# Patient Record
Sex: Male | Born: 1980 | Race: White | Hispanic: No | Marital: Married | Smoking: Current every day smoker
Health system: Southern US, Community
[De-identification: ages and names within clinical notes are randomized; demographics above are authoritative.]

## PROBLEM LIST (undated history)

## (undated) DIAGNOSIS — F419 Anxiety disorder, unspecified: Secondary | ICD-10-CM

## (undated) DIAGNOSIS — E7800 Pure hypercholesterolemia, unspecified: Secondary | ICD-10-CM

## (undated) HISTORY — PX: APPENDECTOMY: SHX54

## (undated) HISTORY — PX: HERNIA REPAIR: SHX51

---

## 2015-05-11 ENCOUNTER — Emergency Department (HOSPITAL_BASED_OUTPATIENT_CLINIC_OR_DEPARTMENT_OTHER): Payer: Medicaid Other

## 2015-05-11 ENCOUNTER — Emergency Department (HOSPITAL_BASED_OUTPATIENT_CLINIC_OR_DEPARTMENT_OTHER)
Admission: EM | Admit: 2015-05-11 | Discharge: 2015-05-12 | Disposition: A | Discharge status: Home / Self Care | Payer: Medicaid Other | Attending: Emergency Medicine | Admitting: Emergency Medicine

## 2015-05-11 ENCOUNTER — Encounter (HOSPITAL_BASED_OUTPATIENT_CLINIC_OR_DEPARTMENT_OTHER): Payer: Self-pay

## 2015-05-11 DIAGNOSIS — Y998 Other external cause status: Secondary | ICD-10-CM | POA: Insufficient documentation

## 2015-05-11 DIAGNOSIS — E780 Pure hypercholesterolemia: Secondary | ICD-10-CM | POA: Insufficient documentation

## 2015-05-11 DIAGNOSIS — F419 Anxiety disorder, unspecified: Secondary | ICD-10-CM | POA: Diagnosis not present

## 2015-05-11 DIAGNOSIS — Z720 Tobacco use: Secondary | ICD-10-CM | POA: Diagnosis not present

## 2015-05-11 DIAGNOSIS — Z79899 Other long term (current) drug therapy: Secondary | ICD-10-CM | POA: Diagnosis not present

## 2015-05-11 DIAGNOSIS — T189XXA Foreign body of alimentary tract, part unspecified, initial encounter: Secondary | ICD-10-CM | POA: Diagnosis present

## 2015-05-11 DIAGNOSIS — X58XXXA Exposure to other specified factors, initial encounter: Secondary | ICD-10-CM | POA: Diagnosis not present

## 2015-05-11 DIAGNOSIS — Y9289 Other specified places as the place of occurrence of the external cause: Secondary | ICD-10-CM | POA: Diagnosis not present

## 2015-05-11 DIAGNOSIS — Y9389 Activity, other specified: Secondary | ICD-10-CM | POA: Insufficient documentation

## 2015-05-11 HISTORY — DX: Anxiety disorder, unspecified: F41.9

## 2015-05-11 HISTORY — DX: Pure hypercholesterolemia, unspecified: E78.00

## 2015-05-11 LAB — CBC WITH DIFFERENTIAL/PLATELET
BASOS ABS: 0.1 10*3/uL (ref 0.0–0.1)
BASOS PCT: 1 % (ref 0–1)
Eosinophils Absolute: 0.3 10*3/uL (ref 0.0–0.7)
Eosinophils Relative: 3 % (ref 0–5)
HEMATOCRIT: 45 % (ref 39.0–52.0)
HEMOGLOBIN: 15.7 g/dL (ref 13.0–17.0)
LYMPHS PCT: 38 % (ref 12–46)
Lymphs Abs: 3.3 10*3/uL (ref 0.7–4.0)
MCH: 31.5 pg (ref 26.0–34.0)
MCHC: 34.9 g/dL (ref 30.0–36.0)
MCV: 90.2 fL (ref 78.0–100.0)
MONO ABS: 0.5 10*3/uL (ref 0.1–1.0)
MONOS PCT: 6 % (ref 3–12)
NEUTROS ABS: 4.5 10*3/uL (ref 1.7–7.7)
NEUTROS PCT: 52 % (ref 43–77)
Platelets: 260 10*3/uL (ref 150–400)
RBC: 4.99 MIL/uL (ref 4.22–5.81)
RDW: 12.2 % (ref 11.5–15.5)
WBC: 8.7 10*3/uL (ref 4.0–10.5)

## 2015-05-11 LAB — COMPREHENSIVE METABOLIC PANEL
ALBUMIN: 3.9 g/dL (ref 3.5–5.0)
ALK PHOS: 43 U/L (ref 38–126)
ALT: 15 U/L — AB (ref 17–63)
AST: 5 U/L — ABNORMAL LOW (ref 15–41)
Anion gap: 8 (ref 5–15)
BILIRUBIN TOTAL: 0.2 mg/dL — AB (ref 0.3–1.2)
BUN: 12 mg/dL (ref 6–20)
CALCIUM: 8.9 mg/dL (ref 8.9–10.3)
CO2: 25 mmol/L (ref 22–32)
CREATININE: 0.83 mg/dL (ref 0.61–1.24)
Chloride: 106 mmol/L (ref 101–111)
GFR calc Af Amer: >60 mL/min (ref >60)
GFR calc non Af Amer: >60 mL/min (ref >60)
GLUCOSE: 108 mg/dL — AB (ref 65–99)
Potassium: 3.7 mmol/L (ref 3.5–5.1)
SODIUM: 139 mmol/L (ref 135–145)
TOTAL PROTEIN: 6.3 g/dL — AB (ref 6.5–8.1)

## 2015-05-11 LAB — LIPASE, BLOOD: Lipase: 55 U/L — ABNORMAL HIGH (ref 22–51)

## 2015-05-11 MED ORDER — FENTANYL CITRATE (PF) 100 MCG/2ML IJ SOLN
50.0000 ug | Freq: Once | INTRAMUSCULAR | Status: AC
  Administered 2015-05-12: 50 ug via INTRAVENOUS
  Filled 2015-05-11: qty 2

## 2015-05-11 MED ORDER — SODIUM CHLORIDE 0.9 % IV BOLUS (SEPSIS)
1000.0000 mL | Freq: Once | INTRAVENOUS | Status: AC
  Administered 2015-05-11: 1000 mL via INTRAVENOUS

## 2015-05-11 MED ORDER — SODIUM CHLORIDE 0.9 % IV SOLN: INTRAVENOUS | Status: DC

## 2015-05-11 MED ORDER — ONDANSETRON HCL 4 MG/2ML IJ SOLN
4.0000 mg | Freq: Once | INTRAMUSCULAR | Status: AC
  Administered 2015-05-11: 4 mg via INTRAVENOUS
  Filled 2015-05-11: qty 2

## 2015-05-11 MED ORDER — LORAZEPAM 2 MG/ML IJ SOLN
1.0000 mg | Freq: Once | INTRAMUSCULAR | Status: AC
  Administered 2015-05-11: 1 mg via INTRAVENOUS
  Filled 2015-05-11: qty 1

## 2015-05-11 MED ORDER — FENTANYL CITRATE (PF) 100 MCG/2ML IJ SOLN
50.0000 ug | Freq: Once | INTRAMUSCULAR | Status: AC
  Administered 2015-05-11: 50 ug via INTRAVENOUS
  Filled 2015-05-11: qty 2

## 2015-05-11 MED ORDER — PANTOPRAZOLE SODIUM 40 MG IV SOLR
40.0000 mg | Freq: Once | INTRAVENOUS | Status: AC
  Administered 2015-05-11: 40 mg via INTRAVENOUS
  Filled 2015-05-11: qty 40

## 2015-05-11 MED ORDER — SODIUM CHLORIDE 0.9 % IV BOLUS (SEPSIS)
500.0000 mL | Freq: Once | INTRAVENOUS | Status: AC
  Administered 2015-05-11: 500 mL via INTRAVENOUS

## 2015-05-11 MED ORDER — SODIUM CHLORIDE 0.9 % IV SOLN
INTRAVENOUS | Status: DC
  Administered 2015-05-12: via INTRAVENOUS

## 2015-05-11 NOTE — ED Notes (Signed)
Reports was drinking out of plastic cup but his grandmother keeps all cups in the freezer even mason jars and thinks he swallowed a piece of glass from where a mason jar busted in the cup.  Pt reports pain with swallowing and abdominal pain

## 2015-05-11 NOTE — ED Notes (Signed)
Pt restless/ writhing d/t abd pain, (denies: throat, chest or epigastric pain, nvd, bleeding, sob, cough, dizziness or other sx), no dyspnea noted.

## 2015-05-11 NOTE — ED Provider Notes (Signed)
CSN: [id number]     Arrival date & time 05/11/15  2050 History  This chart was scribed for Vanetta Mulders, MD by Budd Palmer, ED Scribe. This patient was seen in room MH06/MH06 and the patient's care was started at 10:06 PM.     Chief Complaint  Patient presents with  . Swallowed Foreign Body   The history is provided by the patient. No language interpreter was used.   HPI Comments: Darcey Demma is a 34 y.o. male who presents to the Emergency Department complaining of swallowing a foreign body close to 2 hours ago. Pt states he drank out of a plastic cup kept in the freezer together with glass jars. He reports suspecting he swallowed a piece of glass from where one of the jars broke in the cup. Pt notes associated epigastric and LUQ abdominal pain with swallowing. He states he is taking Protonix once daily. He denies a PMHx of GI problems. Pt denies nausea and vomiting.  Past Medical History  Diagnosis Date  . High cholesterol   . Anxiety    Past Surgical History  Procedure Laterality Date  . Appendectomy    . Hernia repair     No family history on file. Social History  Substance Use Topics  . Smoking status: Current Every Day Smoker -- 1.00 packs/day    Types: Cigarettes  . Smokeless tobacco: None  . Alcohol Use: No    Review of Systems  Constitutional: Negative for fever and chills.  HENT: Negative for rhinorrhea and sore throat.   Eyes: Negative for visual disturbance.  Respiratory: Negative for cough and shortness of breath.   Cardiovascular: Negative for chest pain and leg swelling.  Gastrointestinal: Positive for abdominal pain. Negative for nausea and vomiting.  Genitourinary: Negative for dysuria.  Musculoskeletal: Negative for back pain.  Skin: Negative for rash.  Neurological: Negative for headaches.  Hematological: Does not bruise/bleed easily.    Allergies  Sulfa antibiotics  Home Medications   Prior to Admission medications   Medication Sig Start  Date End Date Taking? Authorizing Provider  alprazolam Prudy Feeler) 2 MG tablet Take 2 mg by mouth at bedtime as needed for sleep.   Yes Historical Provider, MD  atorvastatin (LIPITOR) 20 MG tablet Take 20 mg by mouth daily.   Yes Historical Provider, MD  citalopram (CELEXA) 40 MG tablet Take 40 mg by mouth daily.   Yes Historical Provider, MD  gemfibrozil (LOPID) 600 MG tablet Take 600 mg by mouth 2 (two) times daily before a meal.   Yes Historical Provider, MD  pantoprazole (PROTONIX) 40 MG tablet Take 40 mg by mouth daily.   Yes Historical Provider, MD  traMADol (ULTRAM) 50 MG tablet Take by mouth every 6 (six) hours as needed.   Yes Historical Provider, MD   BP 147/92 mmHg  Pulse 95  Temp(Src) 97.8 F (36.6 C) (Oral)  Resp 22  Ht  (1.778 m)  Wt 209 lb (94.802 kg)  BMI 29.99 kg/m2  SpO2 98% Physical Exam  Constitutional: He is oriented to person, place, and time. He appears well-developed and well-nourished.  HENT:  Head: Normocephalic and atraumatic.  Mouth/Throat: Oropharynx is clear and moist.  Uvula is midline  Eyes: Conjunctivae and EOM are normal. Pupils are equal, round, and reactive to light. Right eye exhibits no discharge. Left eye exhibits no discharge. No scleral icterus.  Cardiovascular: Normal rate, regular rhythm and normal heart sounds.   Pulmonary/Chest: Effort normal and breath sounds normal. No respiratory distress.  Abdominal: Soft. Bowel sounds are normal. There is tenderness. There is no guarding.  Musculoskeletal: He exhibits no edema.  No ankle swelling  Neurological: He is alert and oriented to person, place, and time. No cranial nerve deficit. He exhibits normal muscle tone. Coordination normal.  Skin: Skin is warm and dry. No rash noted. He is not diaphoretic. No erythema.  Psychiatric: He has a normal mood and affect.  Nursing note and vitals reviewed.   ED Course  Procedures  DIAGNOSTIC STUDIES: Oxygen Saturation is 98% on RA, normal by my  interpretation.    COORDINATION OF CARE: 10:12 PM - Discussed possibility of inconclusive XR due to material type. Discussed plans to order diagnostic imaging and anti-nausea medication. Pt advised of plan for treatment and pt agrees.  Labs Review Labs Reviewed  LIPASE, BLOOD - Abnormal; Notable for the following:    Lipase 55 (*)    All other components within normal limits  COMPREHENSIVE METABOLIC PANEL - Abnormal; Notable for the following:    Glucose, Bld 108 (*)    Total Protein 6.3 (*)    AST 5 (*)    ALT 15 (*)    Total Bilirubin 0.2 (*)    All other components within normal limits  CBC WITH DIFFERENTIAL/PLATELET   Results for orders placed or performed during the hospital encounter of 05/11/15  CBC with Differential/Platelet  Result Value Ref Range   WBC 8.7 4.0 - 10.5 K/uL   RBC 4.99 4.22 - 5.81 MIL/uL   Hemoglobin 15.7 13.0 - 17.0 g/dL   HCT 45.4 09.8 - 11.9 %   MCV 90.2 78.0 - 100.0 fL   MCH 31.5 26.0 - 34.0 pg   MCHC 34.9 30.0 - 36.0 g/dL   RDW 14.7 82.9 - 56.2 %   Platelets 260 150 - 400 K/uL   Neutrophils Relative % 52 43 - 77 %   Neutro Abs 4.5 1.7 - 7.7 K/uL   Lymphocytes Relative 38 12 - 46 %   Lymphs Abs 3.3 0.7 - 4.0 K/uL   Monocytes Relative 6 3 - 12 %   Monocytes Absolute 0.5 0.1 - 1.0 K/uL   Eosinophils Relative 3 0 - 5 %   Eosinophils Absolute 0.3 0.0 - 0.7 K/uL   Basophils Relative 1 0 - 1 %   Basophils Absolute 0.1 0.0 - 0.1 K/uL  Lipase, blood  Result Value Ref Range   Lipase 55 (H) 22 - 51 U/L  Comprehensive metabolic panel  Result Value Ref Range   Sodium 139 135 - 145 mmol/L   Potassium 3.7 3.5 - 5.1 mmol/L   Chloride 106 101 - 111 mmol/L   CO2 25 22 - 32 mmol/L   Glucose, Bld 108 (H) 65 - 99 mg/dL   BUN 12 6 - 20 mg/dL   Creatinine, Ser 1.30 0.61 - 1.24 mg/dL   Calcium 8.9 8.9 - 86.5 mg/dL   Total Protein 6.3 (L) 6.5 - 8.1 g/dL   Albumin 3.9 3.5 - 5.0 g/dL   AST 5 (L) 15 - 41 U/L   ALT 15 (L) 17 - 63 U/L   Alkaline Phosphatase 43  38 - 126 U/L   Total Bilirubin 0.2 (L) 0.3 - 1.2 mg/dL   GFR calc non Af Amer >60 >60 mL/min   GFR calc Af Amer >60 >60 mL/min   Anion gap 8 5 - 15     Imaging Review No results found. I have personally reviewed and evaluated these images and lab results  as part of my medical decision-making.   EKG Interpretation None      MDM   Final diagnoses:  Epigastric abdominal pain    Patient by history feels that he swallowed a piece of glass. Immediately had epigastric discomfort. No nausea vomiting or diarrhea no spitting up any blood. No shortness of breath. Patient's vital signs were normal room air sats are 98%. Patient basic labs showed no evidence of any abnormalities with hemoglobin and hematocrit normal white blood cell count. Liver function test are normal. No electrolyte abnormalities. Very slight elevation in lipase at 55. Patient is not certain whether he swallowed glass or not but thinks it's a possibility.  A plain film shows some radiopaque areas throughout the intestines that may represent glass. However patient still with persistent epigastric abdominal pain. Not sure if related to ingestion of a glass or not due to glass will pass on its own without any problems. However due to the persistent pain we'll do CT of the abdomen. Plain films also showed a little bit of air-fluid levels in the small intestine. We'll do a contrast CT scan.  Patient's disposition will be completed by the overnight physician Dr. Read Drivers.  I personally performed the services described in this documentation, which was scribed in my presence. The recorded information has been reviewed and is accurate.    Vanetta Mulders, MD 05/11/15 2330

## 2015-05-11 NOTE — ED Notes (Signed)
Pt to xray via stretcher

## 2015-05-12 MED ORDER — LIDOCAINE VISCOUS 2 % MT SOLN
OROMUCOSAL | Status: AC
Start: 2015-05-12 — End: 2015-05-12
  Administered 2015-05-12: 15 mL via OROMUCOSAL
  Filled 2015-05-12: qty 15

## 2015-05-12 MED ORDER — LIDOCAINE VISCOUS 2 % MT SOLN
15.0000 mL | Freq: Once | OROMUCOSAL | Status: AC
  Administered 2015-05-12: 15 mL via OROMUCOSAL

## 2015-05-12 MED ORDER — IOHEXOL 300 MG/ML  SOLN
25.0000 mL | Freq: Once | INTRAMUSCULAR | Status: AC | PRN
  Administered 2015-05-11: 50 mL via ORAL

## 2015-05-12 MED ORDER — IOHEXOL 300 MG/ML  SOLN
100.0000 mL | Freq: Once | INTRAMUSCULAR | Status: AC | PRN
  Administered 2015-05-12: 100 mL via INTRAVENOUS

## 2015-05-12 MED ORDER — ALUM & MAG HYDROXIDE-SIMETH 200-200-20 MG/5ML PO SUSP
30.0000 mL | Freq: Once | ORAL | Status: AC
  Administered 2015-05-12: 30 mL via ORAL

## 2015-05-12 MED ORDER — ALUM & MAG HYDROXIDE-SIMETH 200-200-20 MG/5ML PO SUSP
ORAL | Status: AC
  Administered 2015-05-12: 30 mL via ORAL
  Filled 2015-05-12: qty 30

## 2015-05-12 NOTE — ED Notes (Signed)
Pt in CT.

## 2015-05-12 NOTE — ED Provider Notes (Signed)
Nursing notes and vitals signs, including pulse oximetry, reviewed.  Summary of this visit's results, reviewed by myself:  Labs:  Results for orders placed or performed during the hospital encounter of 05/11/15 (from the past 24 hour(s))  CBC with Differential/Platelet     Status: None   Collection Time: 05/11/15 10:15 PM  Result Value Ref Range   WBC 8.7 4.0 - 10.5 K/uL   RBC 4.99 4.22 - 5.81 MIL/uL   Hemoglobin 15.7 13.0 - 17.0 g/dL   HCT 16.1 09.6 - 04.5 %   MCV 90.2 78.0 - 100.0 fL   MCH 31.5 26.0 - 34.0 pg   MCHC 34.9 30.0 - 36.0 g/dL   RDW 40.9 81.1 - 91.4 %   Platelets 260 150 - 400 K/uL   Neutrophils Relative % 52 43 - 77 %   Neutro Abs 4.5 1.7 - 7.7 K/uL   Lymphocytes Relative 38 12 - 46 %   Lymphs Abs 3.3 0.7 - 4.0 K/uL   Monocytes Relative 6 3 - 12 %   Monocytes Absolute 0.5 0.1 - 1.0 K/uL   Eosinophils Relative 3 0 - 5 %   Eosinophils Absolute 0.3 0.0 - 0.7 K/uL   Basophils Relative 1 0 - 1 %   Basophils Absolute 0.1 0.0 - 0.1 K/uL  Lipase, blood     Status: Abnormal   Collection Time: 05/11/15 10:15 PM  Result Value Ref Range   Lipase 55 (H) 22 - 51 U/L  Comprehensive metabolic panel     Status: Abnormal   Collection Time: 05/11/15 10:15 PM  Result Value Ref Range   Sodium 139 135 - 145 mmol/L   Potassium 3.7 3.5 - 5.1 mmol/L   Chloride 106 101 - 111 mmol/L   CO2 25 22 - 32 mmol/L   Glucose, Bld 108 (H) 65 - 99 mg/dL   BUN 12 6 - 20 mg/dL   Creatinine, Ser 7.82 0.61 - 1.24 mg/dL   Calcium 8.9 8.9 - 95.6 mg/dL   Total Protein 6.3 (L) 6.5 - 8.1 g/dL   Albumin 3.9 3.5 - 5.0 g/dL   AST 5 (L) 15 - 41 U/L   ALT 15 (L) 17 - 63 U/L   Alkaline Phosphatase 43 38 - 126 U/L   Total Bilirubin 0.2 (L) 0.3 - 1.2 mg/dL   GFR calc non Af Amer >60 >60 mL/min   GFR calc Af Amer >60 >60 mL/min   Anion gap 8 5 - 15    Imaging Studies: Ct Abdomen Pelvis W Contrast  05/12/2015   CLINICAL DATA:  Abdominal pain. Foreign body ingestion. Swallowed a piece of glass. Now with  epigastric and left upper quadrant pain.  EXAM: CT ABDOMEN AND PELVIS WITH CONTRAST  TECHNIQUE: Multidetector CT imaging of the abdomen and pelvis was performed using the standard protocol following bolus administration of intravenous contrast.  CONTRAST:  OMNIPAQUE IOHEXOL 300 MG/ML SOLN, 50mL OMNIPAQUE IOHEXOL 300 MG/ML SOLN  COMPARISON:  Abdominal radiographs 1 day prior.  FINDINGS: The lung bases are clear.  There is a 2.3 cm foreign body in the distal stomach. There multiple additional foreign bodies in the distal small bowel and scattered throughout the colon. For example 1.2 cm foreign body is seen in the distal ileum. A 2.2 cm foreign bodies in the mid small bowel, image number 39. Additional small bowel foreign bodies may be obscured by oral contrast. At least 7 foreign bodies are seen throughout the ascending, transverse and descending colon fall approximately 1  cm. There is no bowel wall thickening or signs of perforation. No free air or free fluid. The appendix is surgically absent.  The liver, gallbladder, spleen, pancreas, adrenal glands, and kidneys appear normal.  No retroperitoneal adenopathy. Abdominal aorta is normal in caliber.  Within the pelvis the bladder is decompressed. Prostate gland is normal in thickness.  There are no acute or suspicious osseous abnormalities.  IMPRESSION: Multiple foreign bodies in the GI tract. The largest is in the stomach measuring 2.3 cm. Additional (at least 7) smaller foreign bodies are seen in the mid small bowel, distal ileum, ascending, transverse and descending colon. No bowel wall thickening or signs of perforation. CT appearance supports the history of swallowed glass.   Electronically Signed   By: Rubye Oaks M.D.   On: 05/12/2015 02:17   Dg Abd Acute W/chest  05/12/2015   CLINICAL DATA:  34 year old male with abdominal pain and possible foreign object intestine.  EXAM: DG ABDOMEN ACUTE W/ 1V CHEST  COMPARISON:  earlier Radiograph dated  05/11/2015  FINDINGS: Multiple radiopaque objects noted in the epigastric area, and left lower quadrant. The largest measures approximately 11 x 18 mm in the left lower quadrant. Smaller radiopaque fragments noted within the pelvis. There is no evidence of bowel obstruction. No free air identified.  IMPRESSION: Multiple radiopaque densities most compatible with the suggested foreign objects. No free air. Clinical correlation and follow-up recommended.   Electronically Signed   By: Elgie Collard M.D.   On: 05/12/2015 00:12      Paula Libra, MD 05/12/15 0222

## 2015-05-12 NOTE — ED Notes (Signed)
Pt alert, NAD, calm, pain increased, med given IVF bolus infusing, speaking with family via phone, tolerating PO contrast, pending CT, pt updated.

## 2015-05-12 NOTE — ED Notes (Signed)
Updated on wait with explanation.  Family x2 at [facility]. VSS. Pain returning.

## 2015-05-12 NOTE — ED Notes (Signed)
Back from CT, no changes, alert, NAD, calm, interactive, no dyspnea noted.  

## 2016-08-10 ENCOUNTER — Observation Stay (HOSPITAL_COMMUNITY)
Admission: AD | Admit: 2016-08-10 | Discharge: 2016-08-12 | Disposition: A | Discharge status: Home / Self Care | Payer: Self-pay | Source: Intra-hospital | Attending: Psychiatry | Admitting: Psychiatry

## 2016-08-10 DIAGNOSIS — F17210 Nicotine dependence, cigarettes, uncomplicated: Secondary | ICD-10-CM | POA: Insufficient documentation

## 2016-08-10 DIAGNOSIS — F329 Major depressive disorder, single episode, unspecified: Principal | ICD-10-CM | POA: Insufficient documentation

## 2016-08-10 DIAGNOSIS — G4700 Insomnia, unspecified: Secondary | ICD-10-CM | POA: Insufficient documentation

## 2016-08-10 DIAGNOSIS — F411 Generalized anxiety disorder: Secondary | ICD-10-CM | POA: Insufficient documentation

## 2016-08-10 DIAGNOSIS — R45851 Suicidal ideations: Secondary | ICD-10-CM | POA: Insufficient documentation

## 2016-08-10 DIAGNOSIS — Z882 Allergy status to sulfonamides status: Secondary | ICD-10-CM | POA: Insufficient documentation

## 2016-08-10 DIAGNOSIS — B353 Tinea pedis: Secondary | ICD-10-CM | POA: Insufficient documentation

## 2016-08-10 DIAGNOSIS — J45909 Unspecified asthma, uncomplicated: Secondary | ICD-10-CM | POA: Insufficient documentation

## 2016-08-10 DIAGNOSIS — Z79899 Other long term (current) drug therapy: Secondary | ICD-10-CM | POA: Insufficient documentation

## 2016-08-10 DIAGNOSIS — E7800 Pure hypercholesterolemia, unspecified: Secondary | ICD-10-CM | POA: Insufficient documentation

## 2016-08-11 ENCOUNTER — Encounter (HOSPITAL_COMMUNITY): Payer: Self-pay | Admitting: *Deleted

## 2016-08-11 DIAGNOSIS — Z9889 Other specified postprocedural states: Secondary | ICD-10-CM

## 2016-08-11 DIAGNOSIS — Z882 Allergy status to sulfonamides status: Secondary | ICD-10-CM

## 2016-08-11 DIAGNOSIS — R45851 Suicidal ideations: Secondary | ICD-10-CM

## 2016-08-11 DIAGNOSIS — R45850 Homicidal ideations: Secondary | ICD-10-CM

## 2016-08-11 DIAGNOSIS — Z79899 Other long term (current) drug therapy: Secondary | ICD-10-CM

## 2016-08-11 DIAGNOSIS — F411 Generalized anxiety disorder: Secondary | ICD-10-CM | POA: Diagnosis present

## 2016-08-11 MED ORDER — MAGNESIUM HYDROXIDE 400 MG/5ML PO SUSP: 30.0000 mL | Freq: Every day | ORAL | Status: DC | PRN

## 2016-08-11 MED ORDER — ACETAMINOPHEN 325 MG PO TABS: 650.0000 mg | ORAL_TABLET | Freq: Four times a day (QID) | ORAL | Status: DC | PRN

## 2016-08-11 MED ORDER — ATORVASTATIN CALCIUM 10 MG PO TABS
20.0000 mg | ORAL_TABLET | Freq: Every day | ORAL | Status: DC
  Administered 2016-08-11 – 2016-08-12 (×2): 20 mg via ORAL
  Filled 2016-08-11 (×2): qty 2

## 2016-08-11 MED ORDER — PANTOPRAZOLE SODIUM 40 MG PO TBEC
40.0000 mg | DELAYED_RELEASE_TABLET | Freq: Every day | ORAL | Status: DC
  Administered 2016-08-11 – 2016-08-12 (×2): 40 mg via ORAL
  Filled 2016-08-11 (×2): qty 1

## 2016-08-11 MED ORDER — ALPRAZOLAM 1 MG PO TABS
1.0000 mg | ORAL_TABLET | Freq: Three times a day (TID) | ORAL | Status: DC
  Administered 2016-08-11 – 2016-08-12 (×4): 1 mg via ORAL
  Filled 2016-08-11 (×4): qty 1

## 2016-08-11 MED ORDER — ALPRAZOLAM 1 MG PO TABS: 2.0000 mg | ORAL_TABLET | Freq: Every evening | ORAL | Status: DC | PRN

## 2016-08-11 MED ORDER — TRAZODONE HCL 50 MG PO TABS
50.0000 mg | ORAL_TABLET | Freq: Every evening | ORAL | Status: DC | PRN
  Administered 2016-08-11: 50 mg via ORAL
  Filled 2016-08-11: qty 1

## 2016-08-11 MED ORDER — ALUM & MAG HYDROXIDE-SIMETH 200-200-20 MG/5ML PO SUSP: 30.0000 mL | ORAL | Status: DC | PRN

## 2016-08-11 MED ORDER — TRAMADOL HCL 50 MG PO TABS: 50.0000 mg | ORAL_TABLET | Freq: Four times a day (QID) | ORAL | Status: DC | PRN

## 2016-08-11 MED ORDER — CITALOPRAM HYDROBROMIDE 20 MG PO TABS
40.0000 mg | ORAL_TABLET | Freq: Every day | ORAL | Status: DC
  Administered 2016-08-11 – 2016-08-12 (×2): 40 mg via ORAL
  Filled 2016-08-11 (×2): qty 4

## 2016-08-11 MED ORDER — GEMFIBROZIL 600 MG PO TABS
600.0000 mg | ORAL_TABLET | Freq: Two times a day (BID) | ORAL | Status: DC
  Administered 2016-08-11 – 2016-08-12 (×3): 600 mg via ORAL
  Filled 2016-08-11 (×5): qty 1

## 2016-08-11 NOTE — Progress Notes (Signed)
Pt in bed watching TV and talking to staff.  Pt joins in to staff conversations and is a bit loud and is very happy.  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  Pt sts he has 4 kids with youngest about 36 yrs old.  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  Pt denies pain or discomfort at this time.  Pt denies SI, HI or AVH and sts he only feels suicidal when he drinks. Pt continuously observed for safety on the unit except when in the bathroom. Pt remains safe.
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
## 2016-08-11 NOTE — Progress Notes (Signed)
Pt presents in a jovial mood all day. Complained of having anxiety but watched tv, talked on the phone and pleasantly spoke to several staff members about his wife and family. Pt appeared calm and cooperative. Denies SI/HI or AVH.

## 2016-08-11 NOTE — Progress Notes (Signed)
08/11/2016- This writer spoke to the patient regarding his disposition. The patient shared he is experiencing alot of anxiety. He spoke with the NP [facility]. Carmell Austriaisha Bijan Ridgley 08/11/2016 11:15AM
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
## 2016-08-12 MED ORDER — ALPRAZOLAM 1 MG PO TABS: 0.5000 mg | ORAL_TABLET | Freq: Three times a day (TID) | ORAL | 0 refills | Status: AC | PRN

## 2016-08-12 NOTE — Discharge Summary (Signed)
Physician Discharge Summary Note  Patient:  Adam Randall is an 35 y.o., male MRN:  [id number] DOB:  1981-08-19 Patient phone:  [phone] (home)  Patient address:   [address] [place] [id number],  Total Time spent with patient: 20 minutes  Date of Admission:  08/10/2016 Date of Discharge: 08/12/16  Reason for Admission:  Depression, Anxiety, and suicidal ideation  Principal Problem: GAD (generalized anxiety disorder) Discharge Diagnoses: Patient Active Problem List   Diagnosis Date Noted  . GAD (generalized anxiety disorder) [F41.1] 08/11/2016    Priority: High    Past Psychiatric History: GAD  Past Medical History:  Past Medical History:  Diagnosis Date  . Anxiety   . High cholesterol     Past Surgical History:  Procedure Laterality Date  . APPENDECTOMY    . HERNIA REPAIR     Family History: History reviewed. No pertinent family history. Family Psychiatric  History: Unknown Social History:  History  Alcohol Use No     History  Drug Use No    Social History   Social History  . Marital status: Married    Spouse name: N/A  . Number of children: N/A  . Years of education: N/A   Social History Main Topics  . Smoking status: Current Every Day Smoker    Packs/day: 1.00    Types: Cigarettes  . Smokeless tobacco: Former NeurosurgeonUser  . Alcohol use No  . Drug use: No  . Sexual activity: Not Asked   Other Topics Concern  . None   Social History Narrative  . None    Hospital Course:  Adam Randall is a 35 year old male who presented to the [facility] with suicidal ideation and depression. [facility]. Pt was calm and cooperative, alert & oriented x 3, and appropriate for the situation.  Pt denies suicidal/homicidal ideation, denies auditory/visual hallucinations and does not appear to be responding to internal stimuli. Pt has PCP and psychiatry follow up appointments upon discharge. Pt was discharged with all  belongings and in stable condition.   Discussed disposition with Dr Lucianne MussKumar who recommends Pt can be discharged home with outpatient resources already in place.   Physical Findings: AIMS: Facial and Oral Movements Muscles of Facial Expression: None, normal Lips and Perioral Area: None, normal Jaw: None, normal Tongue: None, normal,Extremity Movements Upper (arms, wrists, hands, fingers): None, normal Lower (legs, knees, ankles, toes): None, normal, Trunk Movements Neck, shoulders, hips: None, normal, Overall Severity Severity of abnormal movements (highest score from questions above): None, normal Incapacitation due to abnormal movements: None, normal Patient's awareness of abnormal movements (rate only patient's report): No Awareness, Dental Status Current problems with teeth and/or dentures?: No Does patient usually wear dentures?: No  CIWA:  CIWA-Ar Total: 0 COWS:     Musculoskeletal: Strength & Muscle Tone: within normal limits Gait & Station: normal Patient leans: N/A  Psychiatric Specialty Exam: Physical Exam  Review of Systems  Psychiatric/Behavioral: Positive for depression. Negative for hallucinations, memory loss, substance abuse and suicidal ideas. The patient is nervous/anxious. The patient does not have insomnia.     Blood pressure 130/90, pulse 97, temperature 97.9 F (36.6 C), temperature source Oral, resp. rate 16, height 5\' 10"  (1.778 m), weight 97.5 kg (215 lb), SpO2 98 %.Body mass index is 30.85 kg/m.  General Appearance: Casual  Eye Contact:  Good  Speech:  Clear and Coherent and Normal Rate  Volume:  Normal  Mood:  Anxious  Affect:  Congruent  Thought Process:  Coherent  Orientation:  Full (Time, Place, and Person)  Thought Content:  Logical  Suicidal Thoughts:  No  Homicidal Thoughts:  No  Memory:  Immediate;   Good Recent;   Good Remote;   Fair  Judgement:  Good  Insight:  Good  Psychomotor Activity:  Normal  Concentration:  Concentration: Good  and Attention Span: Good  Recall:  Good  Fund of Knowledge:  Good  Language:  Good  Akathisia:  No  Handed:  Right  AIMS (if indicated):     Assets:  Communication Skills Desire for Improvement Financial Resources/Insurance Housing Resilience Social Support Transportation  ADL's:  Intact  Cognition:  WNL  Sleep:           Has this patient used any form of tobacco in the last 30 days? (Cigarettes, Smokeless Tobacco, Cigars, and/or Pipes) Yes, No  Blood Alcohol level:  No results found for: [facility]  Metabolic Disorder Labs:  No results found for: HGBA1C, MPG No results found for: PROLACTIN No results found for: CHOL, TRIG, HDL, CHOLHDL, VLDL, LDLCALC  See Psychiatric Specialty Exam and Suicide Risk Assessment completed by Attending Physician prior to discharge.  Discharge destination:  Home  Is patient on multiple antipsychotic therapies at discharge:  No   Has Patient had three or more failed trials of antipsychotic monotherapy by history:  No  Recommended Plan for Multiple Antipsychotic Therapies: NA     Medication List    STOP taking these medications   ibuprofen 200 MG tablet Commonly known as:  ADVIL,MOTRIN   pantoprazole 40 MG tablet Commonly known as:  PROTONIX     TAKE these medications     Indication  albuterol 108 (90 Base) MCG/ACT inhaler Commonly known as:  PROVENTIL HFA;VENTOLIN HFA Inhale 1-2 puffs into the lungs every 6 (six) hours as needed for wheezing or shortness of breath.  Indication:  Asthma   ALPRAZolam 1 MG tablet Commonly known as:  XANAX Take 0.5-1 tablets (0.5-1 mg total) by mouth 3 (three) times daily as needed for anxiety.  Indication:  Feeling Anxious   atorvastatin 20 MG tablet Commonly known as:  LIPITOR Take 20 mg by mouth daily.  Indication:  Elevation of Both Cholesterol and Triglycerides in Blood   citalopram 40 MG tablet Commonly known as:  CELEXA Take 40 mg by mouth daily.  Indication:  Depression    clotrimazole 1 % cream Commonly known as:  LOTRIMIN Apply 1 application topically 2 (two) times daily as needed (foot fungus).  Indication:  Athlete's Foot   gemfibrozil 600 MG tablet Commonly known as:  LOPID Take 600 mg by mouth 2 (two) times daily before a meal.  Indication:  Increased Fats, Triglycerides & Cholesterol in the Blood        Follow-up recommendations:  Activity:  as tolerated Diet:  regular Other:  Follow up with PCP and Therapy/psychiatry for any new or ongoing needs.  Take all medications as prescribed.   Comments:  Discharge Home   Signed: Laveda AbbeLaurie Britton Miki Labuda, NP 08/12/2016, 9:50 AM

## 2016-08-12 NOTE — Progress Notes (Signed)
Written/verbal discharge instructions, prescription and follow-up information given to patient with verbalization of understanding;  Patient denies suicidal and homicidal ideation. Suicide Prevention information/materials given to patient  All patient belongings returned to patient at time of discharge. Discharged home in stable condition.

## 2016-08-12 NOTE — Progress Notes (Signed)
[facility] OBSERVATION UNIT:  Family/Significant Other Suicide Prevention Education  Suicide Prevention Education:  Education Completed; Adam Randall, wife,has been identified by the patient as the family member/significant other with whom the patient will be residing, and identified as the person(s) who will aid the patient in the event of a mental Randall crisis (suicidal ideations/suicide attempt).  With written consent from the patient, the family member/significant other has been provided the following suicide prevention education, prior to the and/or following the discharge of the patient.  The suicide prevention education provided includes the following:  Suicide risk factors  Suicide prevention and interventions  National Suicide Hotline telephone number  [facility] assessment telephone number  [facility] Emergency Assistance 911  [facility] and/or Residential Mobile Crisis Unit telephone number  Request made of family/significant other to:  Remove weapons (e.g., guns, rifles, knives), all items previously/currently identified as safety concern.    Remove drugs/medications (over-the-counter, prescriptions, illicit drugs), all items previously/currently identified as a safety concern.  The family member/significant other verbalizes understanding of the suicide prevention education information provided.  The family member/significant other agrees to remove the items of safety concern listed above.  Camelia EngKaren H Caleyah Jr 08/12/2016, 10:11 AM   Camelia EngKaren H Eniyah Eastmond, RN 08/12/16  10:09 AM

## 2016-08-12 NOTE — Discharge Planning (Signed)
[facility] Discharge Plan :  Will you be returning to the same living situation after discharge:  Yes,  Going Home  At discharge, do you have transportation home?: Yes,  Relative will provide transport home  Do you have the ability to pay for your medications: Yes,  Applied for Medicaid  Release of information consent forms completed and in the chart;  Patient's signature needed at discharge.  Patient to Follow up at: Follow-up Information    [facility]. Call on 08/12/2016.   Contact information: [address] [place] [address] [phone]           Safety Planning and Suicide Prevention discussed: Yes,  Verbalized understanding   Camelia EngKaren H Cataleia Gade 08/12/2016, 10:08 AM

## 2016-08-12 NOTE — Progress Notes (Signed)
D:  Patient awake and alert; oriented x 4; he denies suicidal and homicidal ideation and AVH; no self-injurious behaviors noted or reported. A:  Medications given as scheduled;  Emotional support provided; encouraged him to seek assistance with needs/concerns. R:  Safety maintained on unit. 

## 2016-08-12 NOTE — Progress Notes (Signed)
08/12/2016- This writer spoke to the patient about his plans after discharge. The patient will be picked up from a friend at 1pm. He requested information about Alcohol recovery services for his substance abuse issues. This Clinical research associatewriter provided a table of information-numbers and locations of local treatment centers. The patient was receptive to the information. Adam Randall 08/12/2016

## 2016-11-02 IMAGING — CT CT ABD-PELV W/ CM
2 of 4 series · 16 of 46 positions shown, 18 images · IV contrast (APPLIED)
Comparison: Abdominal radiographs 1 day prior.

CLINICAL DATA: Abdominal pain. Foreign body ingestion. Swallowed a
piece of glass. Now with epigastric and left upper quadrant pain.

EXAM:
CT ABDOMEN AND PELVIS WITH CONTRAST
TECHNIQUE: Multidetector CT imaging of the abdomen and pelvis was performed
using the standard protocol following bolus administration of
intravenous contrast.
CONTRAST:  100mL OMNIPAQUE IOHEXOL 300 MG/ML SOLN, 50mL OMNIPAQUE
IOHEXOL 300 MG/ML SOLN

[Series 2: abd/pelvis 5.0 b31f · axial · 0.82mm/px · z∈[-541,-91]mm · 13 of 98 slices shown, 15 images]
[im 4/98  soft-tissue]
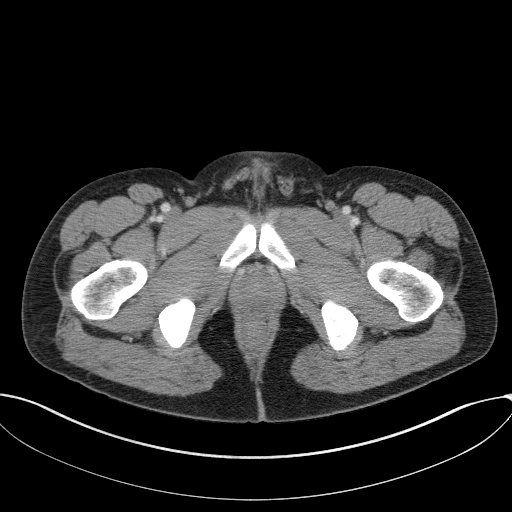
[im 4/98  bone]
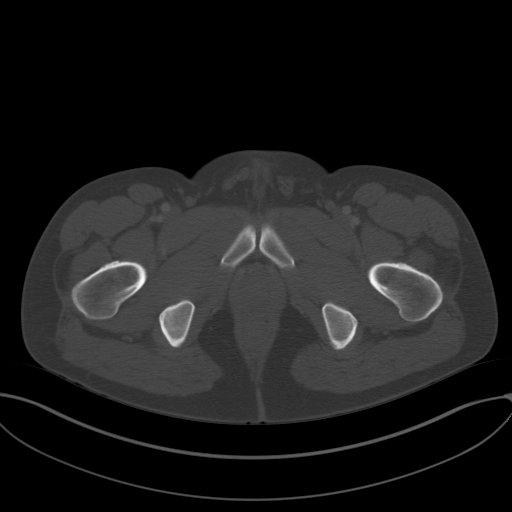
[im 12/98  soft-tissue]
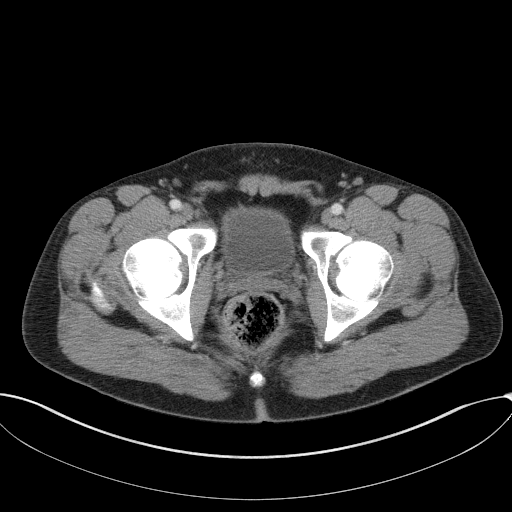
[im 20/98  soft-tissue]
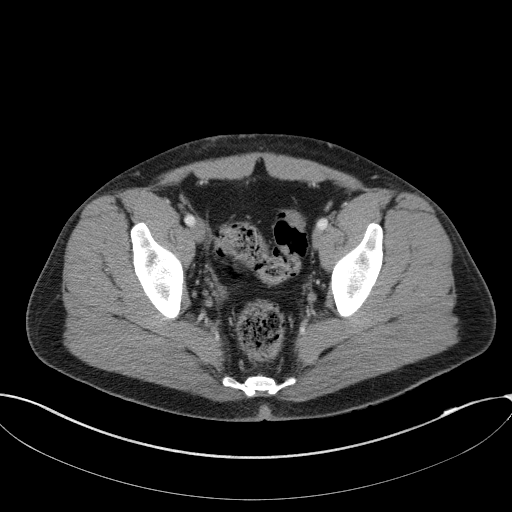
[im 28/98  soft-tissue]
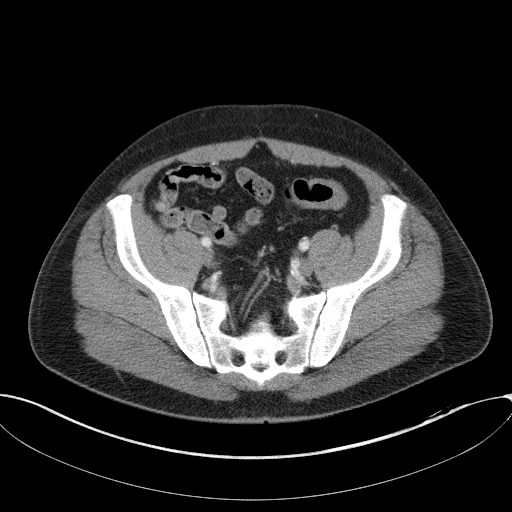
[im 35/98  soft-tissue]
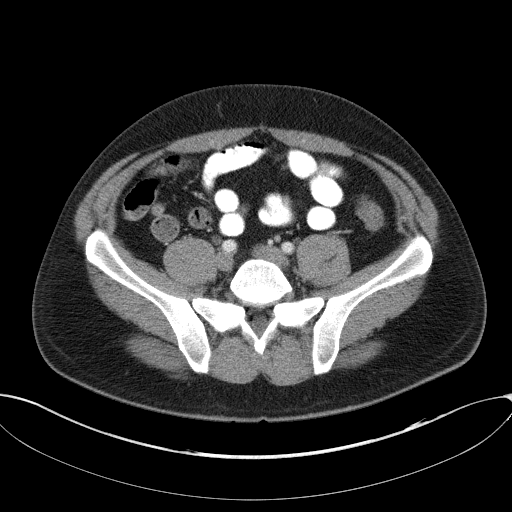
[im 43/98  soft-tissue]
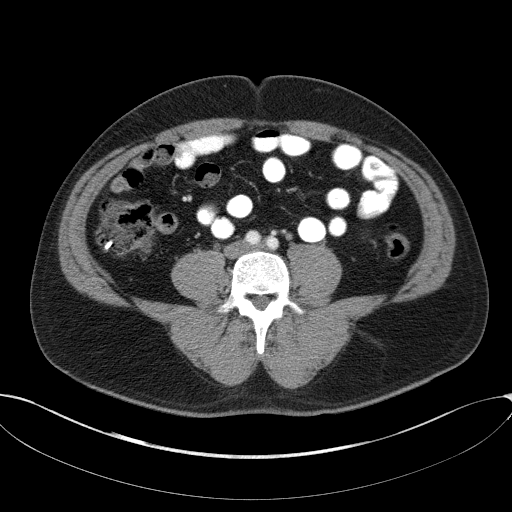
[im 51/98  soft-tissue]
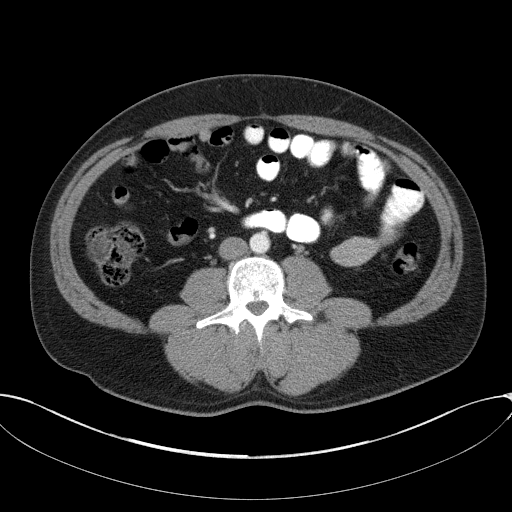
[im 55/98  soft-tissue]
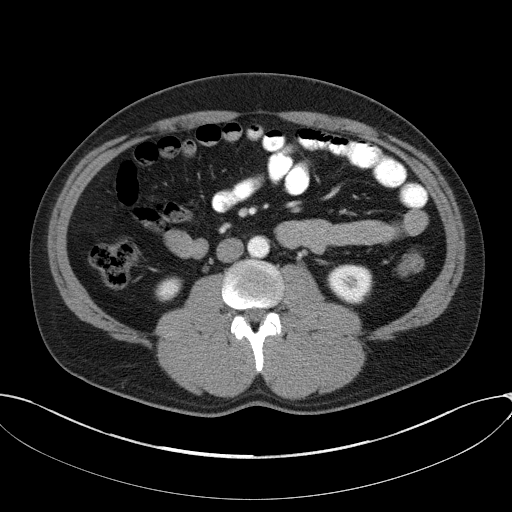
[im 63/98  soft-tissue]
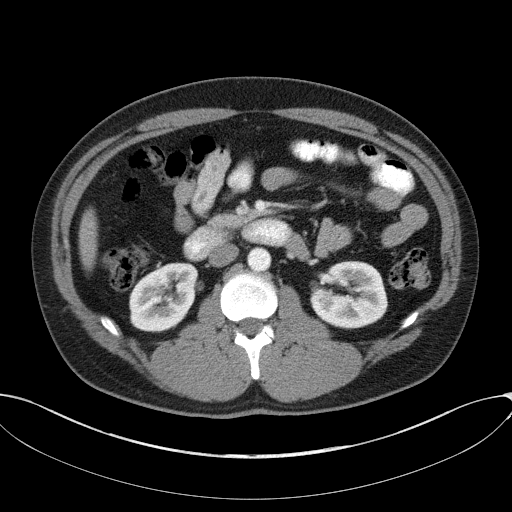
[im 63/98  bone]
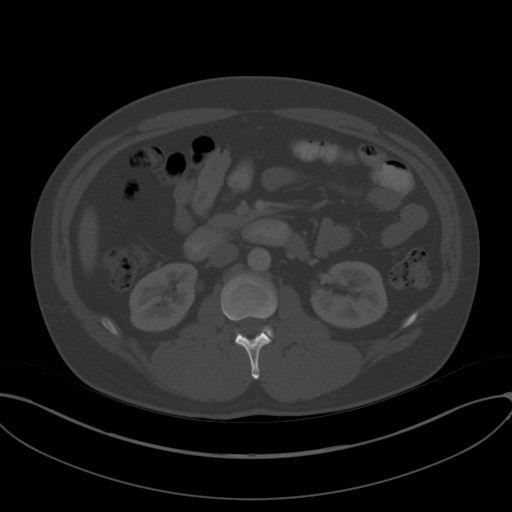
[im 70/98  soft-tissue]
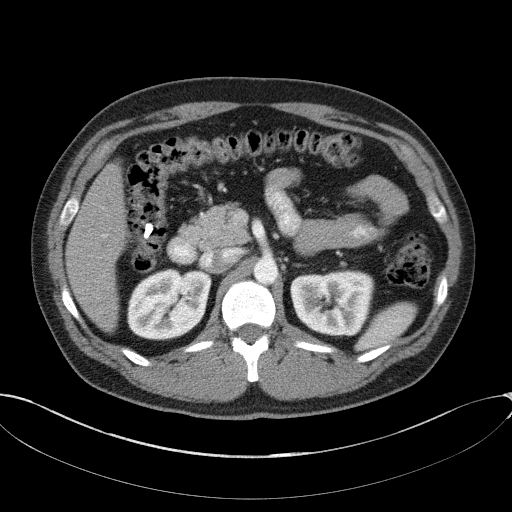
[im 78/98  soft-tissue]
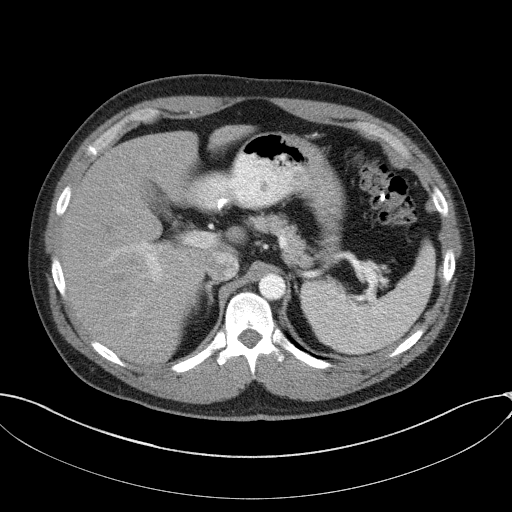
[im 86/98  soft-tissue]
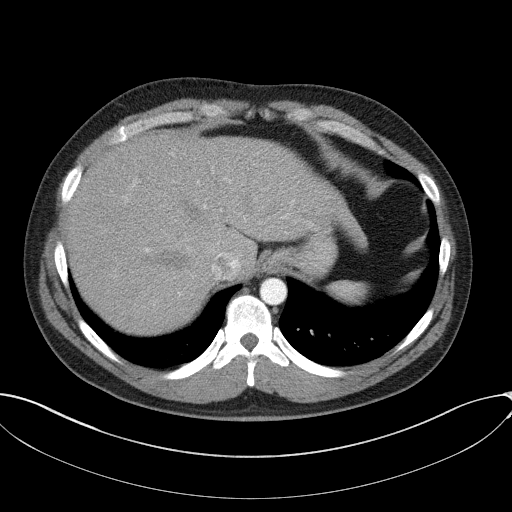
[im 94/98  soft-tissue]
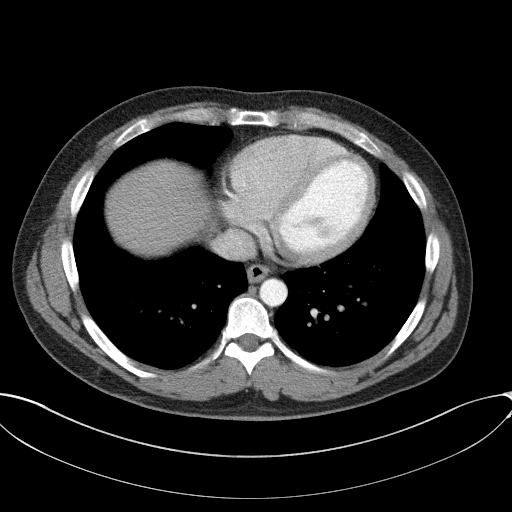

[Series 5: abd/pelvis 3.0 coronal · coronal · 0.90mm/px · 3 of 90 slices shown]
[im 30/90  soft-tissue]
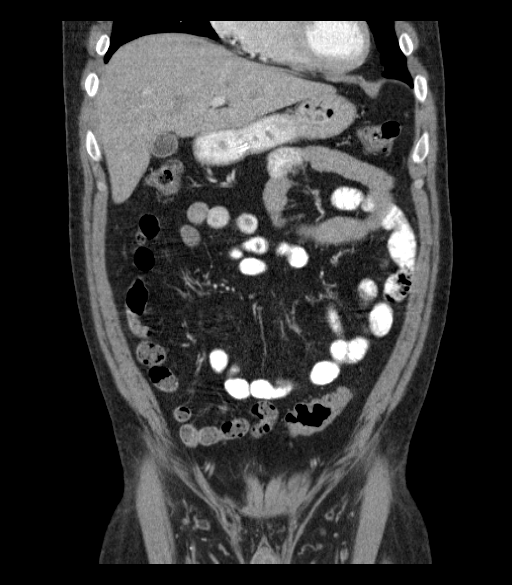
[im 40/90  soft-tissue]
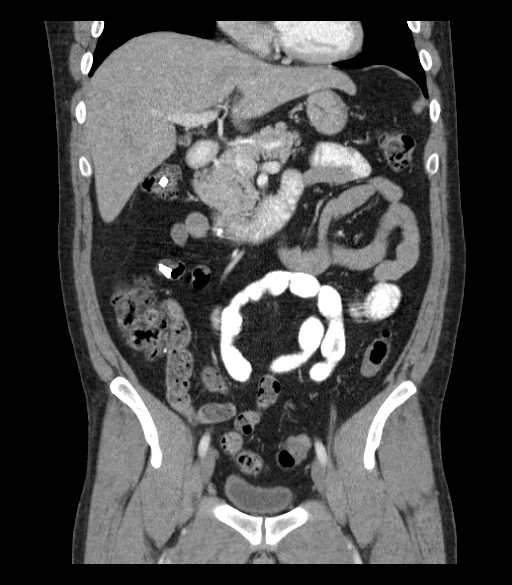
[im 50/90  soft-tissue]
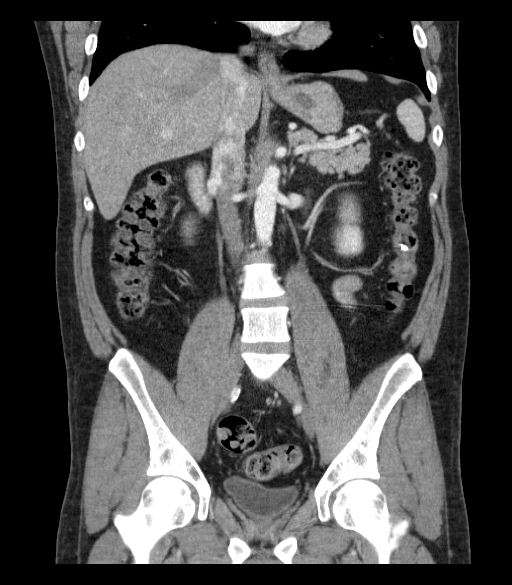

[16 of 46 positions shown; findings below may reference images not displayed]

FINDINGS: The lung bases are clear.

There is a 2.3 cm foreign body in the distal stomach. There multiple
additional foreign bodies in the distal small bowel and scattered
throughout the colon. For example 1.2 cm foreign body is seen in the
distal ileum. A 2.2 cm foreign bodies in the mid small bowel, image
number 39. Additional small bowel foreign bodies may be obscured by
oral contrast. At least 7 foreign bodies are seen throughout the
ascending, transverse and descending colon fall approximately 1 cm.
There is no bowel wall thickening or signs of perforation. No free
air or free fluid. The appendix is surgically absent.

The liver, gallbladder, spleen, pancreas, adrenal glands, and
kidneys appear normal.

No retroperitoneal adenopathy. Abdominal aorta is normal in caliber.

Within the pelvis the bladder is decompressed. Prostate gland is
normal in thickness.

There are no acute or suspicious osseous abnormalities.
IMPRESSION: Multiple foreign bodies in the GI tract. The largest is in the
stomach measuring 2.3 cm. Additional (at least 7) smaller foreign
bodies are seen in the mid small bowel, distal ileum, ascending,
transverse and descending colon. No bowel wall thickening or signs
of perforation. CT appearance supports the history of swallowed
glass.

## 2019-07-31 DEATH — deceased
